# Patient Record
Sex: Female | Born: 1995 | Race: White | Hispanic: No | Marital: Married | Smoking: Never smoker
Health system: Southern US, Community
[De-identification: ages and names within clinical notes are randomized; demographics above are authoritative.]

## PROBLEM LIST (undated history)

## (undated) ENCOUNTER — Inpatient Hospital Stay (HOSPITAL_COMMUNITY): Payer: Self-pay

## (undated) DIAGNOSIS — N83209 Unspecified ovarian cyst, unspecified side: Secondary | ICD-10-CM

## (undated) DIAGNOSIS — N809 Endometriosis, unspecified: Secondary | ICD-10-CM

## (undated) HISTORY — PX: TONSILLECTOMY: SUR1361

## (undated) HISTORY — PX: CRYOTHERAPY: SHX1416

## (undated) HISTORY — PX: DILATION AND CURETTAGE OF UTERUS: SHX78

## (undated) HISTORY — PX: CYSTOTOMY: SHX926

---
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
## 2016-01-18 ENCOUNTER — Inpatient Hospital Stay (HOSPITAL_COMMUNITY): Payer: Managed Care, Other (non HMO)

## 2016-01-18 ENCOUNTER — Encounter (HOSPITAL_COMMUNITY): Payer: Self-pay | Admitting: *Deleted

## 2016-01-18 ENCOUNTER — Inpatient Hospital Stay (HOSPITAL_COMMUNITY)
Admission: AD | Admit: 2016-01-18 | Discharge: 2016-01-18 | Disposition: A | Discharge status: Home / Self Care | Payer: Managed Care, Other (non HMO) | Source: Ambulatory Visit | Attending: Obstetrics and Gynecology | Admitting: Obstetrics and Gynecology

## 2016-01-18 DIAGNOSIS — O3680X0 Pregnancy with inconclusive fetal viability, not applicable or unspecified: Secondary | ICD-10-CM | POA: Insufficient documentation

## 2016-01-18 DIAGNOSIS — R102 Pelvic and perineal pain: Secondary | ICD-10-CM

## 2016-01-18 DIAGNOSIS — Z880 Allergy status to penicillin: Secondary | ICD-10-CM | POA: Insufficient documentation

## 2016-01-18 DIAGNOSIS — Z3A01 Less than 8 weeks gestation of pregnancy: Secondary | ICD-10-CM | POA: Diagnosis not present

## 2016-01-18 DIAGNOSIS — O26891 Other specified pregnancy related conditions, first trimester: Secondary | ICD-10-CM | POA: Diagnosis present

## 2016-01-18 DIAGNOSIS — Z369 Encounter for antenatal screening, unspecified: Secondary | ICD-10-CM | POA: Insufficient documentation

## 2016-01-18 DIAGNOSIS — O3481 Maternal care for other abnormalities of pelvic organs, first trimester: Secondary | ICD-10-CM | POA: Diagnosis not present

## 2016-01-18 DIAGNOSIS — N8312 Corpus luteum cyst of left ovary: Secondary | ICD-10-CM | POA: Insufficient documentation

## 2016-01-18 DIAGNOSIS — O26899 Other specified pregnancy related conditions, unspecified trimester: Secondary | ICD-10-CM

## 2016-01-18 DIAGNOSIS — R1031 Right lower quadrant pain: Secondary | ICD-10-CM | POA: Diagnosis present

## 2016-01-18 DIAGNOSIS — R109 Unspecified abdominal pain: Secondary | ICD-10-CM

## 2016-01-18 LAB — WET PREP, GENITAL
CLUE CELLS WET PREP: NONE SEEN
SPERM: NONE SEEN
Trich, Wet Prep: NONE SEEN
Yeast Wet Prep HPF POC: NONE SEEN

## 2016-01-18 LAB — POCT PREGNANCY, URINE: PREG TEST UR: POSITIVE — AB

## 2016-01-18 NOTE — Discharge Instructions (Signed)
First Trimester of Pregnancy  The first trimester of pregnancy is from week 1 until the end of week 12 (months 1 through 3). A week after a sperm fertilizes an egg, the egg will implant on the wall of the uterus. This embryo will begin to develop into a baby. Genes from you and your partner are forming the baby. The female genes determine whether the baby is a boy or a girl. At 6-8 weeks, the eyes and face are formed, and the heartbeat can be seen on ultrasound. At the end of 12 weeks, all the baby's organs are formed.   Now that you are pregnant, you will want to do everything you can to have a healthy baby. Two of the most important things are to get good prenatal care and to follow your health care provider's instructions. Prenatal care is all the medical care you receive before the baby's birth. This care will help prevent, find, and treat any problems during the pregnancy and childbirth.  BODY CHANGES  Your body goes through many changes during pregnancy. The changes vary from woman to woman.   · You may gain or lose a couple of pounds at first.  · You may feel sick to your stomach (nauseous) and throw up (vomit). If the vomiting is uncontrollable, call your health care provider.  · You may tire easily.  · You may develop headaches that can be relieved by medicines approved by your health care provider.  · You may urinate more often. Painful urination may mean you have a bladder infection.  · You may develop heartburn as a result of your pregnancy.  · You may develop constipation because certain hormones are causing the muscles that push waste through your intestines to slow down.  · You may develop hemorrhoids or swollen, bulging veins (varicose veins).  · Your breasts may begin to grow larger and become tender. Your nipples may stick out more, and the tissue that surrounds them (areola) may become darker.  · Your gums may bleed and may be sensitive to brushing and flossing.   · Dark spots or blotches (chloasma, mask of pregnancy) may develop on your face. This will likely fade after the baby is born.  · Your menstrual periods will stop.  · You may have a loss of appetite.  · You may develop cravings for certain kinds of food.  · You may have changes in your emotions from day to day, such as being excited to be pregnant or being concerned that something may go wrong with the pregnancy and baby.  · You may have more vivid and strange dreams.  · You may have changes in your hair. These can include thickening of your hair, rapid growth, and changes in texture. Some women also have hair loss during or after pregnancy, or hair that feels dry or thin. Your hair will most likely return to normal after your baby is born.  WHAT TO EXPECT AT YOUR PRENATAL VISITS  During a routine prenatal visit:  · You will be weighed to make sure you and the baby are growing normally.  · Your blood pressure will be taken.  · Your abdomen will be measured to track your baby's growth.  · The fetal heartbeat will be listened to starting around week 10 or 12 of your pregnancy.  · Test results from any previous visits will be discussed.  Your health care provider may ask you:  · How you are feeling.  · If you   are feeling the baby move.  · If you have had any abnormal symptoms, such as leaking fluid, bleeding, severe headaches, or abdominal cramping.  · If you are using any tobacco products, including cigarettes, chewing tobacco, and electronic cigarettes.  · If you have any questions.  Other tests that may be performed during your first trimester include:  · Blood tests to find your blood type and to check for the presence of any previous infections. They will also be used to check for low iron levels (anemia) and Rh antibodies. Later in the pregnancy, blood tests for diabetes will be done along with other tests if problems develop.  · Urine tests to check for infections, diabetes, or protein in the urine.   · An ultrasound to confirm the proper growth and development of the baby.  · An amniocentesis to check for possible genetic problems.  · Fetal screens for spina bifida and Down syndrome.  · You may need other tests to make sure you and the baby are doing well.  · HIV (human immunodeficiency virus) testing. Routine prenatal testing includes screening for HIV, unless you choose not to have this test.  HOME CARE INSTRUCTIONS   Medicines  · Follow your health care provider's instructions regarding medicine use. Specific medicines may be either safe or unsafe to take during pregnancy.  · Take your prenatal vitamins as directed.  · If you develop constipation, try taking a stool softener if your health care provider approves.  Diet  · Eat regular, well-balanced meals. Choose a variety of foods, such as meat or vegetable-based protein, fish, milk and low-fat dairy products, vegetables, fruits, and whole grain breads and cereals. Your health care provider will help you determine the amount of weight gain that is right for you.  · Avoid raw meat and uncooked cheese. These carry germs that can cause birth defects in the baby.  · Eating four or five small meals rather than three large meals a day may help relieve nausea and vomiting. If you start to feel nauseous, eating a few soda crackers can be helpful. Drinking liquids between meals instead of during meals also seems to help nausea and vomiting.  · If you develop constipation, eat more high-fiber foods, such as fresh vegetables or fruit and whole grains. Drink enough fluids to keep your urine clear or pale yellow.  Activity and Exercise  · Exercise only as directed by your health care provider. Exercising will help you:    Control your weight.    Stay in shape.    Be prepared for labor and delivery.  · Experiencing pain or cramping in the lower abdomen or low back is a good sign that you should stop exercising. Check with your health care provider  before continuing normal exercises.  · Try to avoid standing for long periods of time. Move your legs often if you must stand in one place for a long time.  · Avoid heavy lifting.  · Wear low-heeled shoes, and practice good posture.  · You may continue to have sex unless your health care provider directs you otherwise.  Relief of Pain or Discomfort  · Wear a good support bra for breast tenderness.    · Take warm sitz baths to soothe any pain or discomfort caused by hemorrhoids. Use hemorrhoid cream if your health care provider approves.    · Rest with your legs elevated if you have leg cramps or low back pain.  · If you develop varicose veins in your   legs, wear support hose. Elevate your feet for 15 minutes, 3-4 times a day. Limit salt in your diet.  Prenatal Care  · Schedule your prenatal visits by the twelfth week of pregnancy. They are usually scheduled monthly at first, then more often in the last 2 months before delivery.  · Write down your questions. Take them to your prenatal visits.  · Keep all your prenatal visits as directed by your health care provider.  Safety  · Wear your seat belt at all times when driving.  · Make a list of emergency phone numbers, including numbers for family, friends, the hospital, and police and fire departments.  General Tips  · Ask your health care provider for a referral to a local prenatal education class. Begin classes no later than at the beginning of month 6 of your pregnancy.  · Ask for help if you have counseling or nutritional needs during pregnancy. Your health care provider can offer advice or refer you to specialists for help with various needs.  · Do not use hot tubs, steam rooms, or saunas.  · Do not douche or use tampons or scented sanitary pads.  · Do not cross your legs for long periods of time.  · Avoid cat litter boxes and soil used by cats. These carry germs that can cause birth defects in the baby and possibly loss of the fetus by miscarriage or stillbirth.   · Avoid all smoking, herbs, alcohol, and medicines not prescribed by your health care provider. Chemicals in these affect the formation and growth of the baby.  · Do not use any tobacco products, including cigarettes, chewing tobacco, and electronic cigarettes. If you need help quitting, ask your health care provider. You may receive counseling support and other resources to help you quit.  · Schedule a dentist appointment. At home, brush your teeth with a soft toothbrush and be gentle when you floss.  SEEK MEDICAL CARE IF:   · You have dizziness.  · You have mild pelvic cramps, pelvic pressure, or nagging pain in the abdominal area.  · You have persistent nausea, vomiting, or diarrhea.  · You have a bad smelling vaginal discharge.  · You have pain with urination.  · You notice increased swelling in your face, hands, legs, or ankles.  SEEK IMMEDIATE MEDICAL CARE IF:   · You have a fever.  · You are leaking fluid from your vagina.  · You have spotting or bleeding from your vagina.  · You have severe abdominal cramping or pain.  · You have rapid weight gain or loss.  · You vomit blood or material that looks like coffee grounds.  · You are exposed to German measles and have never had them.  · You are exposed to fifth disease or chickenpox.  · You develop a severe headache.  · You have shortness of breath.  · You have any kind of trauma, such as from a fall or a car accident.     This information is not intended to replace advice given to you by your health care provider. Make sure you discuss any questions you have with your health care provider.     Document Released: 01/22/2001 Document Revised: 02/18/2014 Document Reviewed: 12/08/2012  Elsevier Interactive Patient Education ©2017 [employer]vier Inc.

## 2016-01-18 NOTE — MAU Provider Note (Signed)
Chief Complaint: Abdominal Pain   First Provider Initiated Contact with Patient 01/18/16 1843     SUBJECTIVE HPI: Ellen Cherry is a 20 y.o. G3P0010 at 11010w0d who presents to Maternity Admissions reporting reporting RLQ pain x 2 days. [facility] [place] and states she was told they did not rise appropriately. (Upon review of Care Everywhere the quant rose 92%.) States her provider told her to go to the hospital for US since the office was closing and [place] was having w/ abnormal [place].     12/5: 1445  12/7: 2775  Location: RLQ Quality: cramping, sometimes sharp Severity: 6/10 on pain scale Duration: 2 days Course: unchanged Context: Early pregnancy Timing: constant w/ intermittent exacerbations Modifying factors: None. Hasn't tried anything for Sx.  Associated signs and symptoms: Pos for vaginal discharge. Neg for fever, chills, VB, urinary complaints, GI complaints.    Past Medical History:  Diagnosis Date  . Endometriosis   . Ovarian cyst    OB History  Gravida Para Term Preterm AB Living  3       1    SAB TAB Ectopic Multiple Live Births  1            # Outcome Date GA Lbr Len/2nd Weight Sex Delivery Anes PTL Lv  3 Current           2 SAB           1 Gravida              Past Surgical History:  Procedure Laterality Date  . CRYOTHERAPY    . CYSTOTOMY    . DILATION AND CURETTAGE OF UTERUS    . TONSILLECTOMY     Social History   Social History  . Marital status: Married    Spouse name: N/A  . Number of children: N/A  . Years of education: N/A   Occupational History  . Not on file.   Social History Main Topics  . Smoking status: Never Smoker  . Smokeless tobacco: Never Used  . Alcohol use No  . Drug use: No  . Sexual activity: Not on file   Other Topics Concern  . Not on file   Social History Narrative  . No narrative on file   No current facility-administered medications on file prior to encounter.     Current Outpatient Prescriptions on File Prior to Encounter  Medication Sig Dispense Refill  . Prenatal Vit-Fe Fumarate-FA (PRENATAL MULTIVITAMIN) TABS tablet Take 1 tablet by mouth daily.     Allergies  Allergen Reactions  . Penicillins Anaphylaxis, Hives and Other (See Comments)    Has patient had a PCN reaction causing immediate rash, facial/tongue/throat swelling, SOB or lightheadedness with hypotension: Yes Has patient had a PCN reaction causing severe rash involving mucus membranes or skin necrosis: No Has patient had a PCN reaction that required hospitalization No Has patient had a PCN reaction occurring within the last 10 years: Yes If all of the above answers are "NO", then may proceed with Cephalosporin use.  . Flagyl [Metronidazole] Hives    I have reviewed the past Medical Hx, Surgical Hx, Social Hx, Allergies and Medications.   Review of Systems  Constitutional: Negative for appetite change, chills and fever.  Gastrointestinal: Positive for abdominal pain. Negative for abdominal distention, constipation, diarrhea, nausea and vomiting.  Genitourinary: Positive for vaginal discharge. Negative for dysuria, flank pain, hematuria and vaginal bleeding.  Musculoskeletal: Negative for back pain.  OBJECTIVE Patient Vitals for the past 24 hrs:  BP Temp Temp src Pulse Resp Height Weight  01/18/16 1824 134/71 98.9 F (37.2 C) Oral 114 18 5\' 3"  (1.6 m) 127 lb 12.8 oz (58 kg)   Constitutional: Well-developed, well-nourished female in no acute distress.  Cardiovascular: Mild tachycardia Respiratory: normal rate and effort.  GI: Abd soft, non-tender. Pos BS x 4 MS: Extremities nontender, no edema, normal ROM Neurologic: Alert and oriented x 4.  GU: Neg CVAT.  SPECULUM EXAM: NEFG, physiologic discharge, no blood noted, cervix clean  BIMANUAL: cervix closed; uterus top-normal size, no adnexal tenderness or masses. No CMT.  LAB RESULTS Results for orders placed or performed  during the hospital encounter of 01/18/16 (from the past 24 hour(s))  Wet prep, genital     Status: Abnormal   Collection Time: 01/18/16  7:01 PM  Result Value Ref Range   Yeast Wet Prep HPF POC NONE SEEN NONE SEEN   Trich, Wet Prep NONE SEEN NONE SEEN   Clue Cells Wet Prep HPF POC NONE SEEN NONE SEEN   WBC, Wet Prep HPF POC MANY (A) NONE SEEN   Sperm NONE SEEN   Pregnancy, urine POC     Status: Abnormal   Collection Time: 01/18/16  7:19 PM  Result Value Ref Range   Preg Test, Ur POSITIVE (A) NEGATIVE    IMAGING [place] Ob Comp Less 14 Wks  Result Date: 01/18/2016 CLINICAL DATA:  Abdominal pain EXAM: OBSTETRIC <14 WK US AND TRANSVAGINAL OB US TECHNIQUE: Both transabdominal and transvaginal ultrasound examinations were performed for complete evaluation of the gestation as well as the maternal uterus, adnexal regions, and pelvic cul-de-sac. Transvaginal technique was performed to assess early pregnancy. COMPARISON:  None. FINDINGS: Intrauterine gestational sac: Single Yolk sac:  Possibly visualized. Embryo:  Not Visualized. Cardiac Activity: Not Visualized. MSD: 4  mm   5 w   1  d Subchorionic hemorrhage:  None visualized. Maternal uterus/adnexae: Left corpus luteum cyst. Otherwise normal bilateral ovaries. Small amount of pelvic free fluid. IMPRESSION: 1. Probable early intrauterine gestational sac, but no yolk sac, fetal pole, or cardiac activity yet visualized. Recommend follow-up quantitative B-HCG levels and follow-up US in 14 days to assess viability. This recommendation follows SRU consensus guidelines: Diagnostic Criteria for Nonviable Pregnancy Early in the First Trimester. Malva Limes Engl J Med 2013; 161:0960-45; 369:1443-51. Electronically Signed   By: Elige KoHetal  Patel   On: 01/18/2016 20:03   [place] Ob Transvaginal  Result Date: 01/18/2016 CLINICAL DATA:  Abdominal pain EXAM: OBSTETRIC <14 WK US AND TRANSVAGINAL OB US TECHNIQUE: Both transabdominal and transvaginal ultrasound examinations were performed for complete  evaluation of the gestation as well as the maternal uterus, adnexal regions, and pelvic cul-de-sac. Transvaginal technique was performed to assess early pregnancy. COMPARISON:  None. FINDINGS: Intrauterine gestational sac: Single Yolk sac:  Possibly visualized. Embryo:  Not Visualized. Cardiac Activity: Not Visualized. MSD: 4  mm   5 w   1  d Subchorionic hemorrhage:  None visualized. Maternal uterus/adnexae: Left corpus luteum cyst. Otherwise normal bilateral ovaries. Small amount of pelvic free fluid. IMPRESSION: 1. Probable early intrauterine gestational sac, but no yolk sac, fetal pole, or cardiac activity yet visualized. Recommend follow-up quantitative B-HCG levels and follow-up US in 14 days to assess viability. This recommendation follows SRU consensus guidelines: Diagnostic Criteria for Nonviable Pregnancy Early in the First Trimester. Malva Limes Engl J Med 2013; 409:8119-14; 369:1443-51. Electronically Signed   By: Elige KoHetal  Patel   On: 01/18/2016 20:03    MAU  COURSE ultrasound, wet prep and GC/chlamydia culture, UA.Marland Kitchen Refused bloodwork. Able to review HCG and ABO/Rh from [facility] office.  Reviewed [place] with the patient.   Care of pt turned over to [employer] ,Cnm at 0800. [place] results pending.   [place], CNM 01/18/2016  7:57 PM  Assessment/Plan  1. Pregnancy of unknown anatomic location   2. Abdominal pain affecting pregnancy, antepartum    Offered patient to have US done here in 7-10 days or at [facility] office. She would like to have US done at [facility] office.   DC home Comfort measures reviewed  1st Trimester precautions  Bleeding precautions Ectopic precautions RX: none  Return to MAU as needed FU with OB as planned  Follow-up Information    Reynold Bowen, MD Follow up.   Specialty:  Obstetrics and Gynecology Contact information: [address] [place] [address]

## 2016-01-18 NOTE — MAU Note (Signed)
Pt reports she is pregnant. C/O RLQ pain for  About 3 days. [place] and was called today that her BHCG level did not double and since she was having pain she needed to come  In to make sure it is not an ectopic. Pt stated she has had 3 2 miscarriages before and it feels a little like the same thing.

## 2016-01-19 LAB — GC/CHLAMYDIA PROBE AMP (~~LOC~~) NOT AT ARMC
CHLAMYDIA, DNA PROBE: NEGATIVE
Neisseria Gonorrhea: NEGATIVE

## 2016-02-11 ENCOUNTER — Inpatient Hospital Stay (HOSPITAL_COMMUNITY): Payer: Managed Care, Other (non HMO)

## 2016-02-11 ENCOUNTER — Inpatient Hospital Stay (HOSPITAL_COMMUNITY)
Admission: AD | Admit: 2016-02-11 | Discharge: 2016-02-11 | Disposition: A | Discharge status: Home / Self Care | Payer: Managed Care, Other (non HMO) | Source: Ambulatory Visit | Attending: Obstetrics and Gynecology | Admitting: Obstetrics and Gynecology

## 2016-02-11 ENCOUNTER — Encounter (HOSPITAL_COMMUNITY): Payer: Self-pay | Admitting: Student

## 2016-02-11 DIAGNOSIS — R1030 Lower abdominal pain, unspecified: Secondary | ICD-10-CM | POA: Insufficient documentation

## 2016-02-11 DIAGNOSIS — Z3491 Encounter for supervision of normal pregnancy, unspecified, first trimester: Secondary | ICD-10-CM

## 2016-02-11 DIAGNOSIS — O219 Vomiting of pregnancy, unspecified: Secondary | ICD-10-CM

## 2016-02-11 DIAGNOSIS — Z3A01 Less than 8 weeks gestation of pregnancy: Secondary | ICD-10-CM | POA: Diagnosis not present

## 2016-02-11 DIAGNOSIS — R109 Unspecified abdominal pain: Secondary | ICD-10-CM

## 2016-02-11 DIAGNOSIS — O209 Hemorrhage in early pregnancy, unspecified: Secondary | ICD-10-CM | POA: Insufficient documentation

## 2016-02-11 DIAGNOSIS — O210 Mild hyperemesis gravidarum: Secondary | ICD-10-CM | POA: Insufficient documentation

## 2016-02-11 DIAGNOSIS — O26899 Other specified pregnancy related conditions, unspecified trimester: Secondary | ICD-10-CM

## 2016-02-11 LAB — URINALYSIS, ROUTINE W REFLEX MICROSCOPIC
Bilirubin Urine: NEGATIVE
GLUCOSE, UA: NEGATIVE mg/dL
Hgb urine dipstick: NEGATIVE
KETONES UR: 5 mg/dL — AB
LEUKOCYTES UA: NEGATIVE
Nitrite: NEGATIVE
PH: 5 (ref 5.0–8.0)
Protein, ur: NEGATIVE mg/dL
SPECIFIC GRAVITY, URINE: 1.026 (ref 1.005–1.030)

## 2016-02-11 LAB — WET PREP, GENITAL
CLUE CELLS WET PREP: NONE SEEN
SPERM: NONE SEEN
Trich, Wet Prep: NONE SEEN
Yeast Wet Prep HPF POC: NONE SEEN

## 2016-02-11 MED ORDER — PROMETHAZINE HCL 25 MG PO TABS: 25.0000 mg | ORAL_TABLET | Freq: Four times a day (QID) | ORAL | 0 refills | Status: AC | PRN
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
## 2016-02-11 NOTE — MAU Note (Signed)
Patient presents with vaginal bleeding since Friday, spotting on Friday and Saturday, today clumpy, not wearing a pad.

## 2016-02-11 NOTE — Discharge Instructions (Signed)
Morning Sickness Morning sickness is when you feel sick to your stomach (nauseous) during pregnancy. This nauseous feeling may or may not come with vomiting. It often occurs in the morning but can be a problem any time of day. Morning sickness is most common during the first trimester, but it may continue throughout pregnancy. While morning sickness is unpleasant, it is usually harmless unless you develop severe and continual vomiting (hyperemesis gravidarum). This condition requires more intense treatment. What are the causes? The cause of morning sickness is not completely known but seems to be related to normal hormonal changes that occur in pregnancy. What increases the risk? You are at greater risk if you:  Experienced nausea or vomiting before your pregnancy.  Had morning sickness during a previous pregnancy.  Are pregnant with more than one baby, such as twins. How is this treated? Do not use any medicines (prescription, over-the-counter, or herbal) for morning sickness without first talking to your health care provider. Your health care provider may prescribe or recommend:  Vitamin B6 supplements.  Anti-nausea medicines.  The herbal medicine ginger. Follow these instructions at home:  Only take over-the-counter or prescription medicines as directed by your health care provider.  Taking multivitamins before getting pregnant can prevent or decrease the severity of morning sickness in most women.  Eat a piece of dry toast or unsalted crackers before getting out of bed in the morning.  Eat five or six small meals a day.  Eat dry and bland foods (rice, baked potato). Foods high in carbohydrates are often helpful.  Do not drink liquids with your meals. Drink liquids between meals.  Avoid greasy, fatty, and spicy foods.  Get someone to cook for you if the smell of any food causes nausea and vomiting.  If you feel nauseous after taking prenatal vitamins, take the vitamins at  night or with a snack.  Snack on protein foods (nuts, yogurt, cheese) between meals if you are hungry.  Eat unsweetened gelatins for desserts.  Wearing an acupressure wristband (worn for sea sickness) may be helpful.  Acupuncture may be helpful.  Do not smoke.  Get a humidifier to keep the air in your house free of odors.  Get plenty of fresh air. Contact a health care provider if:  Your home remedies are not working, and you need medicine.  You feel dizzy or lightheaded.  You are losing weight. Get help right away if:  You have persistent and uncontrolled nausea and vomiting.  You pass out (faint). This information is not intended to replace advice given to you by your health care provider. Make sure you discuss any questions you have with your health care provider. Document Released: 03/21/2006 Document Revised: 07/06/2015 Document Reviewed: 07/15/2012 Elsevier Interactive Patient Education  2017 Elsevier Inc. Vaginal Bleeding During Pregnancy, First Trimester A small amount of bleeding (spotting) from the vagina is common in early pregnancy. Sometimes the bleeding is normal and is not a problem, and sometimes it is a sign of something serious. Be sure to tell your doctor about any bleeding from your vagina right away. Follow these instructions at home:  Watch your condition for any changes.  Follow your doctor's instructions about how active you can be.  If you are on bed rest:  You may need to stay in bed and only get up to use the bathroom.  You may be allowed to do some activities.  If you need help, make plans for someone to help you.  Write down:  The number of pads you use each day.  How often you change pads.  How soaked (saturated) your pads are.  Do not use tampons.  Do not douche.  Do not have sex or orgasms until your doctor says it is okay.  If you pass any tissue from your vagina, save the tissue so you can show it to your doctor.  Only  take medicines as told by your doctor.  Do not take aspirin because it can make you bleed.  Keep all follow-up visits as told by your doctor. Contact a doctor if:  You bleed from your vagina.  You have cramps.  You have labor pains.  You have a fever that does not go away after you take medicine. Get help right away if:  You have very bad cramps in your back or belly (abdomen).  You pass large clots or tissue from your vagina.  You bleed more.  You feel light-headed or weak.  You pass out (faint).  You have chills.  You are leaking fluid or have a gush of fluid from your vagina.  You pass out while pooping (having a bowel movement). This information is not intended to replace advice given to you by your health care provider. Make sure you discuss any questions you have with your health care provider. Document Released: 06/14/2013 Document Revised: 07/06/2015 Document Reviewed: 10/05/2012 Elsevier Interactive Patient Education  2017 [employer]

## 2016-02-11 NOTE — MAU Provider Note (Signed)
History     CSN: [id number]  Arrival date and time: 02/11/16 1451   First Provider Initiated Contact with Patient 02/11/16 1601       Chief Complaint  Patient presents with  . Vaginal Bleeding   HPI Ellen Cherry is a 20 y.o. G3P0020 at 4117w5d who presents with vaginal bleeding & abdominal cramping. Symptoms began Friday after having intercourse. Initially was brown spotting on toilet paper that turned red and pink today. Also reports lower abdominal pain that is intermittent & cramp like. Rates pain 5/10. Has not treated. Endorses nausea, no vomiting. Has diclegis but hasn't taken it b/c she is concerned about taking too many medications; pt recently diagnosed with sinusitis & started on abx. Hx of SABs x 2; states they "started out like this" and she is very nervous. [facility] [place] Call [place] & was told to go to hospital for evaluation. Was seen there last week & had ultrasound that confirmed IUP (per Care Everywhere).   OB History    Gravida Para Term Preterm AB Living   3       2     SAB TAB Ectopic Multiple Live Births   2              Past Medical History:  Diagnosis Date  . Endometriosis   . Ovarian cyst     Past Surgical History:  Procedure Laterality Date  . CRYOTHERAPY    . CYSTOTOMY    . DILATION AND CURETTAGE OF UTERUS    . TONSILLECTOMY      Family History  Problem Relation Age of Onset  . Cancer Mother   . Hypertension Father   . Hypertension Paternal Grandfather     Social History  Substance Use Topics  . Smoking status: Never Smoker  . Smokeless tobacco: Never Used  . Alcohol use No    Allergies:  Allergies  Allergen Reactions  . Penicillins Anaphylaxis, Hives and Other (See Comments)    Has patient had a PCN reaction causing immediate rash, facial/tongue/throat swelling, SOB or lightheadedness with hypotension: Yes Has patient had a PCN reaction causing severe rash involving mucus membranes or skin necrosis: No Has patient  had a PCN reaction that required hospitalization No Has patient had a PCN reaction occurring within the last 10 years: Yes If all of the above answers are "NO", then may proceed with Cephalosporin use.  . Flagyl [Metronidazole] Hives    Prescriptions Prior to Admission  Medication Sig Dispense Refill Last Dose  . Prenatal Vit-Fe Fumarate-FA (PRENATAL MULTIVITAMIN) TABS tablet Take 1 tablet by mouth daily.   01/18/2016 at Unknown time  . progesterone (PROMETRIUM) 200 MG capsule Take 200 mg by mouth every evening.    01/17/2016 at Unknown time    Review of Systems  Constitutional: Negative.   Gastrointestinal: Positive for abdominal pain and nausea. Negative for constipation, diarrhea and vomiting.  Genitourinary: Negative for dysuria.       + vaginal bleeding   Physical Exam   Blood pressure 113/74, pulse 104, temperature 98 F (36.7 C), temperature source Oral, resp. rate 16, height 5\' 3"  (1.6 m), weight 127 lb 0.6 oz (57.6 kg), last menstrual period 12/14/2015, unknown if currently breastfeeding.  Physical Exam  Nursing note and vitals reviewed. Constitutional: She is oriented to person, place, and time. She appears well-developed and well-nourished. No distress.  HENT:  Head: Normocephalic and atraumatic.  Eyes: Conjunctivae are normal. Right eye exhibits no discharge. Left eye  exhibits no discharge. No scleral icterus.  Neck: Normal range of motion.  Cardiovascular: Normal rate, regular rhythm and normal heart sounds.   No murmur heard. Respiratory: Effort normal and breath sounds normal. No respiratory distress. She has no wheezes.  GI: Soft. Bowel sounds are normal. She exhibits no distension. There is no tenderness. There is no rebound and no guarding.  Genitourinary: No bleeding in the vagina. Vaginal discharge (small amount of thin tan discharge) found.  Genitourinary Comments: Cervix closed  Neurological: She is alert and oriented to person, place, and time.  Skin: Skin is  warm and dry. She is not diaphoretic.  Psychiatric: She has a normal mood and affect. Her behavior is normal. Judgment and thought content normal.    MAU Course  Procedures Results for orders placed or performed during the hospital encounter of 02/11/16 (from the past 24 hour(s))  Urinalysis, Routine w reflex microscopic     Status: Abnormal   Collection Time: 02/11/16  3:10 PM  Result Value Ref Range   Color, Urine YELLOW YELLOW   APPearance HAZY (A) CLEAR   Specific Gravity, Urine 1.026 1.005 - 1.030   pH 5.0 5.0 - 8.0   Glucose, UA NEGATIVE NEGATIVE mg/dL   Hgb urine dipstick NEGATIVE NEGATIVE   Bilirubin Urine NEGATIVE NEGATIVE   Ketones, ur 5 (A) NEGATIVE mg/dL   Protein, ur NEGATIVE NEGATIVE mg/dL   Nitrite NEGATIVE NEGATIVE   Leukocytes, UA NEGATIVE NEGATIVE  Wet prep, genital     Status: Abnormal   Collection Time: 02/11/16  4:22 PM  Result Value Ref Range   Yeast Wet Prep HPF POC NONE SEEN NONE SEEN   Trich, Wet Prep NONE SEEN NONE SEEN   Clue Cells Wet Prep HPF POC NONE SEEN NONE SEEN   WBC, Wet Prep HPF POC FEW (A) NONE SEEN   Sperm NONE SEEN    [place] Ob Transvaginal  Result Date: 02/11/2016 CLINICAL DATA:  Pregnant patient with lower abdominal cramping and vaginal bleeding. EXAM: OBSTETRIC <14 WK ULTRASOUND TECHNIQUE: Transabdominal ultrasound was performed for evaluation of the gestation as well as the maternal uterus and adnexal regions. COMPARISON:  None. FINDINGS: Intrauterine gestational sac: Single Yolk sac:  Visualized. Embryo:  Visualized. Cardiac Activity: Visualized. Heart Rate: 186 bpm CRL:   17  mm   8 w 0 d                  US EDC: 09/22/2016 Subchorionic hemorrhage:  None visualized. Maternal uterus/adnexae: Unremarkable right and left ovaries. No free fluid in the pelvis. IMPRESSION: Single live intrauterine gestation.  No subchorionic hemorrhage. Electronically Signed   By: Annia Beltrew  Buccieri M.D.   On: 02/11/2016 16:58    MDM U/a & wet prep Ultrasound -- SIUP  with cardiac activity No blood on exam  Assessment and Plan  A; 1. Normal IUP (intrauterine pregnancy) on prenatal ultrasound, first trimester   2. Vaginal bleeding in pregnancy, first trimester   3. Abdominal cramping affecting pregnancy   4. Nausea and vomiting during pregnancy prior to [redacted] weeks gestation    P: Discharge home Rx phenergan Pelvic rest Keep f/u with OB Discussed reasons to return  Ellen Cherry Ellen Cherry 02/11/2016, 3:51 PM

## 2016-07-28 ENCOUNTER — Inpatient Hospital Stay (HOSPITAL_COMMUNITY)
Admission: AD | Admit: 2016-07-28 | Discharge: 2016-07-28 | Disposition: A | Discharge status: Home / Self Care | Payer: Managed Care, Other (non HMO) | Source: Ambulatory Visit | Attending: Obstetrics & Gynecology | Admitting: Obstetrics & Gynecology

## 2016-07-28 ENCOUNTER — Encounter (HOSPITAL_COMMUNITY): Payer: Self-pay

## 2016-07-28 DIAGNOSIS — R51 Headache: Secondary | ICD-10-CM | POA: Diagnosis present

## 2016-07-28 DIAGNOSIS — N83209 Unspecified ovarian cyst, unspecified side: Secondary | ICD-10-CM | POA: Diagnosis not present

## 2016-07-28 DIAGNOSIS — H9202 Otalgia, left ear: Secondary | ICD-10-CM

## 2016-07-28 DIAGNOSIS — R519 Headache, unspecified: Secondary | ICD-10-CM

## 2016-07-28 DIAGNOSIS — Z809 Family history of malignant neoplasm, unspecified: Secondary | ICD-10-CM | POA: Diagnosis not present

## 2016-07-28 DIAGNOSIS — Z8249 Family history of ischemic heart disease and other diseases of the circulatory system: Secondary | ICD-10-CM | POA: Diagnosis not present

## 2016-07-28 DIAGNOSIS — Z3A31 31 weeks gestation of pregnancy: Secondary | ICD-10-CM | POA: Diagnosis not present

## 2016-07-28 DIAGNOSIS — O3483 Maternal care for other abnormalities of pelvic organs, third trimester: Secondary | ICD-10-CM | POA: Diagnosis present

## 2016-07-28 DIAGNOSIS — R202 Paresthesia of skin: Secondary | ICD-10-CM | POA: Diagnosis not present

## 2016-07-28 DIAGNOSIS — Z880 Allergy status to penicillin: Secondary | ICD-10-CM | POA: Diagnosis not present

## 2016-07-28 DIAGNOSIS — Z888 Allergy status to other drugs, medicaments and biological substances status: Secondary | ICD-10-CM | POA: Diagnosis not present

## 2016-07-28 DIAGNOSIS — O26893 Other specified pregnancy related conditions, third trimester: Secondary | ICD-10-CM | POA: Insufficient documentation

## 2016-07-28 LAB — URINALYSIS, ROUTINE W REFLEX MICROSCOPIC
Bilirubin Urine: NEGATIVE
GLUCOSE, UA: NEGATIVE mg/dL
Ketones, ur: NEGATIVE mg/dL
Leukocytes, UA: NEGATIVE
Nitrite: NEGATIVE
PROTEIN: NEGATIVE mg/dL
Specific Gravity, Urine: 1.012 (ref 1.005–1.030)
pH: 6 (ref 5.0–8.0)

## 2016-07-28 MED ORDER — BUTALBITAL-APAP-CAFFEINE 50-325-40 MG PO TABS
2.0000 | ORAL_TABLET | Freq: Once | ORAL | Status: AC
  Administered 2016-07-28: 2 via ORAL
  Filled 2016-07-28: qty 2

## 2016-07-28 NOTE — MAU Provider Note (Signed)
History   G3P0020 @ 31.5 wks in withj c/o headache, and what she thinks is a ear infection. C/o tingling in ear and next to ear on her face. Denies ROM, vag bleeding or abd pain. Gets her care in [place].  CSN: [id number]  Arrival date & time 07/28/16  1809   None     No chief complaint on file.   HPI  Past Medical History:  Diagnosis Date  . Endometriosis   . Ovarian cyst     Past Surgical History:  Procedure Laterality Date  . CRYOTHERAPY    . CYSTOTOMY    . DILATION AND CURETTAGE OF UTERUS    . TONSILLECTOMY      Family History  Problem Relation Age of Onset  . Cancer Mother   . Hypertension Father   . Hypertension Paternal Grandfather     Social History  Substance Use Topics  . Smoking status: Never Smoker  . Smokeless tobacco: Never Used  . Alcohol use No    OB History    Gravida Para Term Preterm AB Living   3 0     2     SAB TAB Ectopic Multiple Live Births   2              Review of Systems  Constitutional: Negative.   HENT: Positive for ear pain.   Eyes: Negative.   Respiratory: Negative.   Cardiovascular: Negative.   Gastrointestinal: Negative.   Endocrine: Negative.   Genitourinary: Negative.   Musculoskeletal: Negative.   Skin: Negative.   Allergic/Immunologic: Negative.   Neurological: Positive for headaches.  Hematological: Negative.   Psychiatric/Behavioral: Negative.     Allergies  Amoxicillin; Penicillins; and Flagyl [metronidazole]  Home Medications    LMP 12/14/2015   SpO2 99%   Physical Exam  Constitutional: She is oriented to person, place, and time. She appears well-developed and well-nourished.  HENT:  Head: Normocephalic.  Eyes: Pupils are equal, round, and reactive to light.  Neck: Normal range of motion.  Cardiovascular: Normal rate and normal heart sounds.   Pulmonary/Chest: Effort normal and breath sounds normal.  Abdominal: Soft.  Genitourinary: Vagina normal and uterus normal.  Musculoskeletal:  Normal range of motion.  Neurological: She is alert and oriented to person, place, and time. She has normal reflexes.  Skin: Skin is warm and dry.  Psychiatric: She has a normal mood and affect. Her behavior is normal. Judgment and thought content normal.    MAU Course  Procedures (including critical care time)  Labs Reviewed  URINALYSIS, ROUTINE W REFLEX MICROSCOPIC - Abnormal; Notable for the following:       Result Value   Hgb urine dipstick SMALL (*)    Bacteria, UA RARE (*)    Squamous Epithelial / LPF 0-5 (*)    All other components within normal limits   No results found.   No diagnosis found.    MDM  Neuro intact, no deficits. VSS, FHR pattern reassuring. Will treat headache with fioricet and if resolves d/c home.

## 2016-07-28 NOTE — MAU Note (Signed)
Pt was admitted this past Wednesday to [facility] for preterm labor, received Magnesium Sulfate and steroids and was released Friday.  Today started with numbness on the left side of her face and goes down the back of her neck. She reports headaches and dizziness for the past week; tylenol has not relieved headaches and  has been on metoprolol for bp this preg.   Denies any painful contractions, vag bleeding or leaking.

## 2016-07-28 NOTE — Discharge Instructions (Signed)
General Headache Without Cause A headache is pain or discomfort felt around the head or neck area. The specific cause of a headache may not be found. There are many causes and types of headaches. A few common ones are:  Tension headaches.  Migraine headaches.  Cluster headaches.  Chronic daily headaches.  Follow these instructions at home: Watch your condition for any changes. Take these steps to help with your condition: Managing pain  Take over-the-counter and prescription medicines only as told by your health care provider.  Lie down in a dark, quiet room when you have a headache.  If directed, apply ice to the head and neck area: ? Put ice in a plastic bag. ? Place a towel between your skin and the bag. ? Leave the ice on for 20 minutes, 2-3 times per day.  Use a heating pad or hot shower to apply heat to the head and neck area as told by your health care provider.  Keep lights dim if bright lights bother you or make your headaches worse. Eating and drinking  Eat meals on a regular schedule.  Limit alcohol use.  Decrease the amount of caffeine you drink, or stop drinking caffeine. General instructions  Keep all follow-up visits as told by your health care provider. This is important.  Keep a headache journal to help find out what may trigger your headaches. For example, write down: ? What you eat and drink. ? How much sleep you get. ? Any change to your diet or medicines.  Try massage or other relaxation techniques.  Limit stress.  Sit up straight, and do not tense your muscles.  Do not use tobacco products, including cigarettes, chewing tobacco, or e-cigarettes. If you need help quitting, ask your health care provider.  Exercise regularly as told by your health care provider.  Sleep on a regular schedule. Get 7-9 hours of sleep, or the amount recommended by your health care provider. Contact a health care provider if:  Your symptoms are not helped by  medicine.  You have a headache that is different from the usual headache.  You have nausea or you vomit.  You have a fever. Get help right away if:  Your headache becomes severe.  You have repeated vomiting.  You have a stiff neck.  You have a loss of vision.  You have problems with speech.  You have pain in the eye or ear.  You have muscular weakness or loss of muscle control.  You lose your balance or have trouble walking.  You feel faint or pass out.  You have confusion. This information is not intended to replace advice given to you by your health care provider. Make sure you discuss any questions you have with your health care provider. Document Released: 01/28/2005 Document Revised: 07/06/2015 Document Reviewed: 05/23/2014 Elsevier Interactive Patient Education  2017 [employer]  

## 2016-07-28 NOTE — Progress Notes (Addendum)
G3P0 @ 31.[redacted] wksga. Presents to triage for left ear numbness and pressure in ear. Dizziness and ha included when triage nurse assessed pt. Took tylenol 650 mg for the ha @ 1200. States helped some.   Denies any OB issues. No LOF or bleeding or ctx  Admitted to [place] [facility] Wednesday for Mgso4 and betamethasone and released on Friday. Went to urgent care today and was told since pregnant she needed to get care else where.   1905: Provider at [facility] assessing. Looking inside both ears.   1910: fiorcet 2 tabs orders.   2005: pt states feeling better and ha gone.   Discharge instructions given with pt understanding.

## 2016-11-22 ENCOUNTER — Encounter (HOSPITAL_COMMUNITY): Payer: Self-pay

## 2017-11-03 IMAGING — US US OB TRANSVAGINAL
1 series · 15 of 28 positions shown · non-contrast
Comparison: None.

CLINICAL DATA: Abdominal pain

EXAM:
OBSTETRIC <14 WK US AND TRANSVAGINAL OB US
TECHNIQUE: Both transabdominal and transvaginal ultrasound examinations were
performed for complete evaluation of the gestation as well as the
maternal uterus, adnexal regions, and pelvic cul-de-sac.
Transvaginal technique was performed to assess early pregnancy.

[Series 1: us ob transvaginal · 61 acquisitions, 15 frames shown]
[im 1/61]
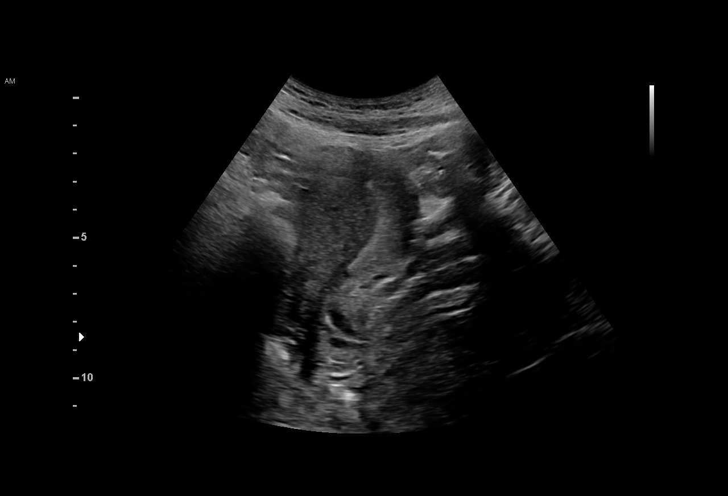
[im 5/61]
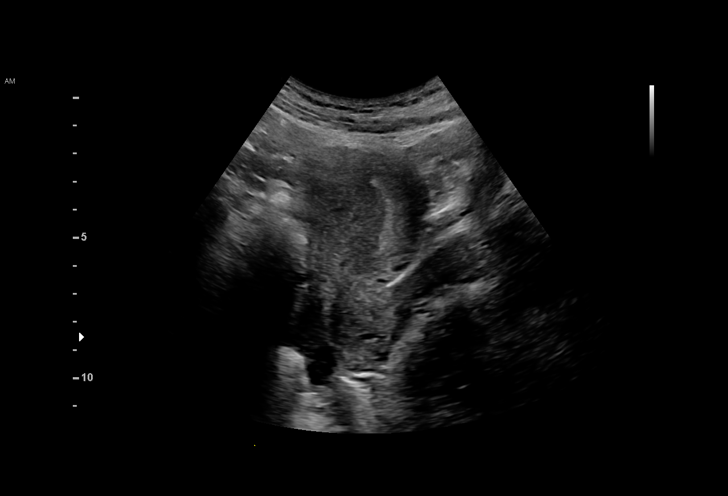
[im 9/61]
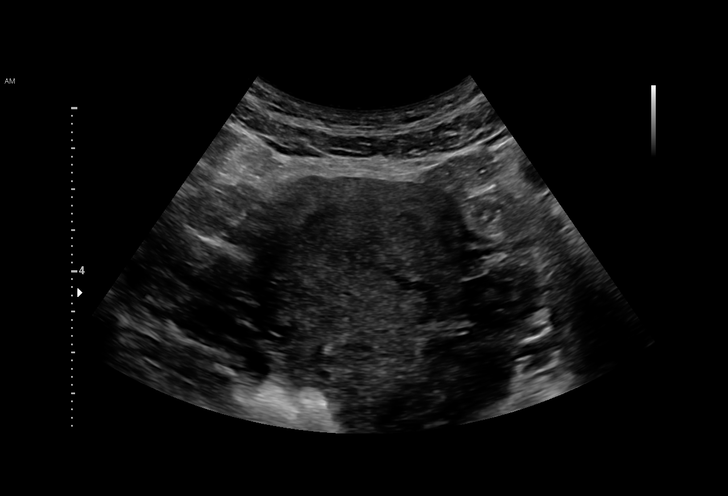
[im 14/61]
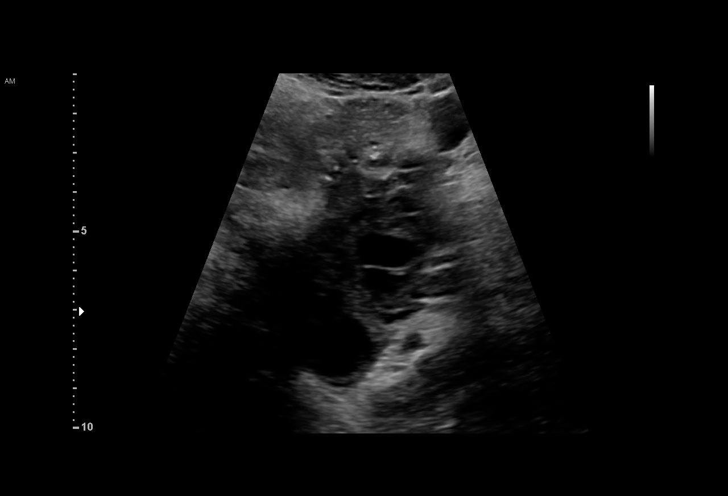
[im 18/61]
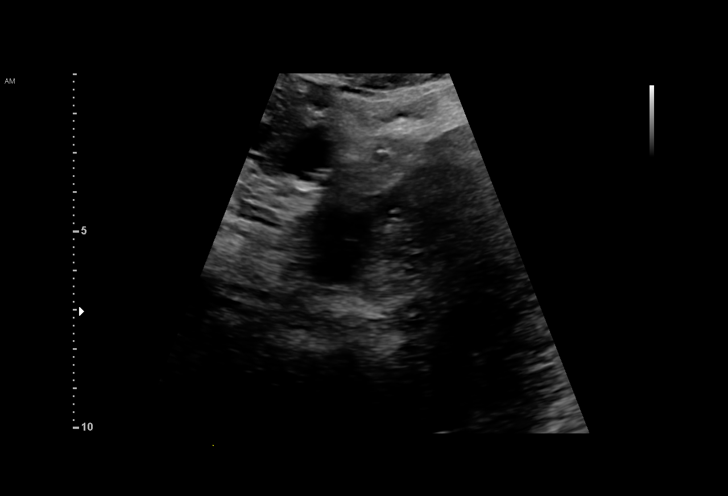
[im 23/61]
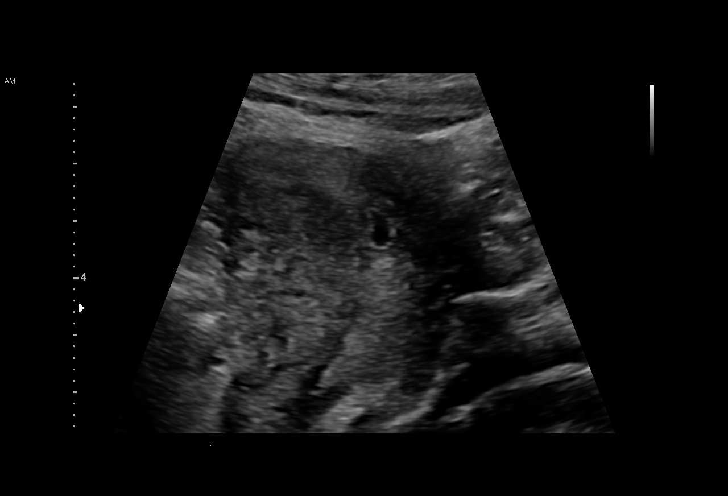
[im 27/61]
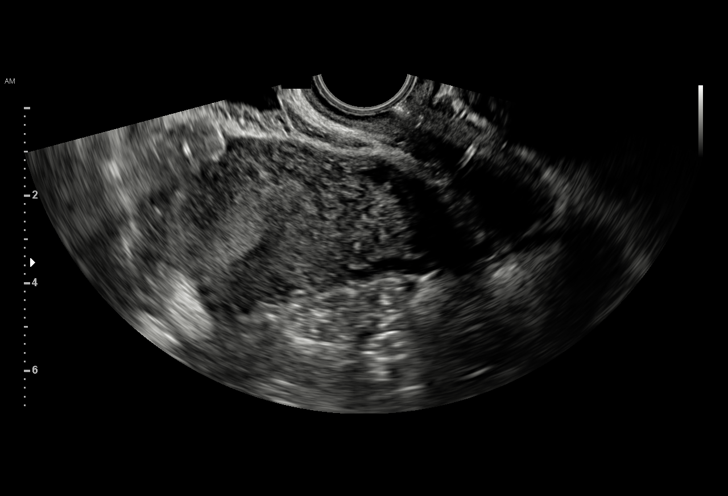
[im 32/61]
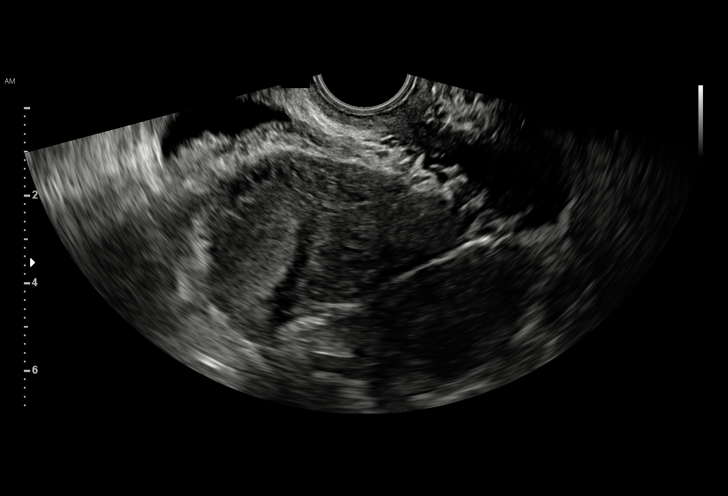
[im 34/61]
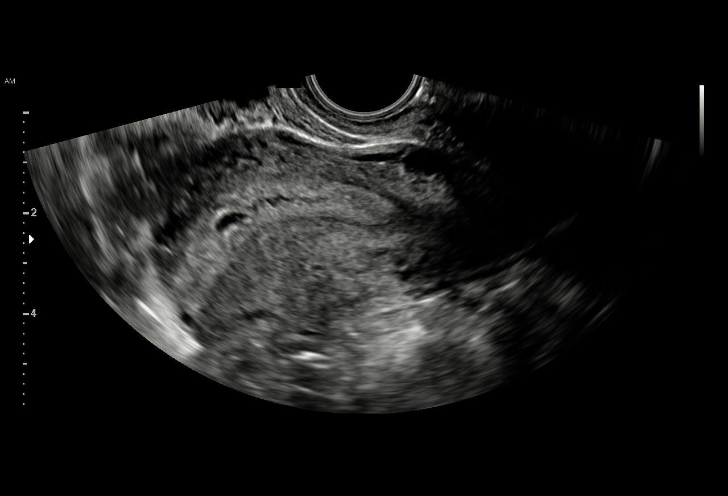
[im 38/61]
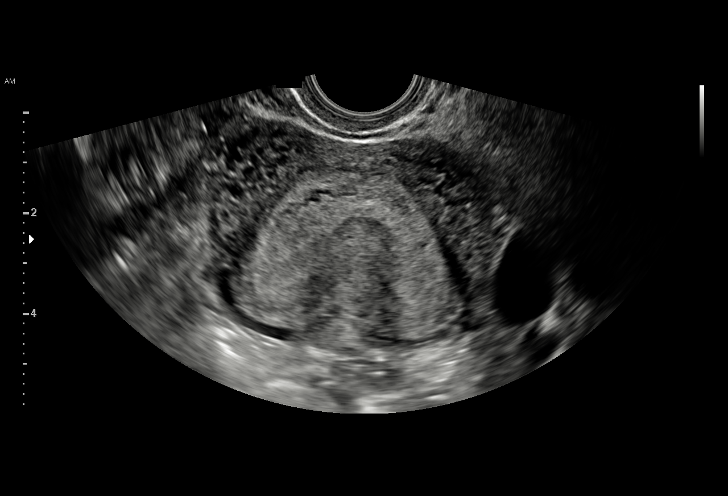
[im 43/61]
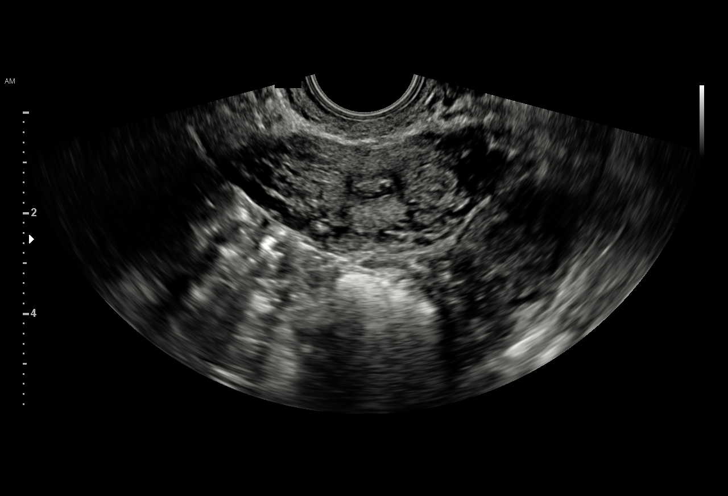
[im 47/61]
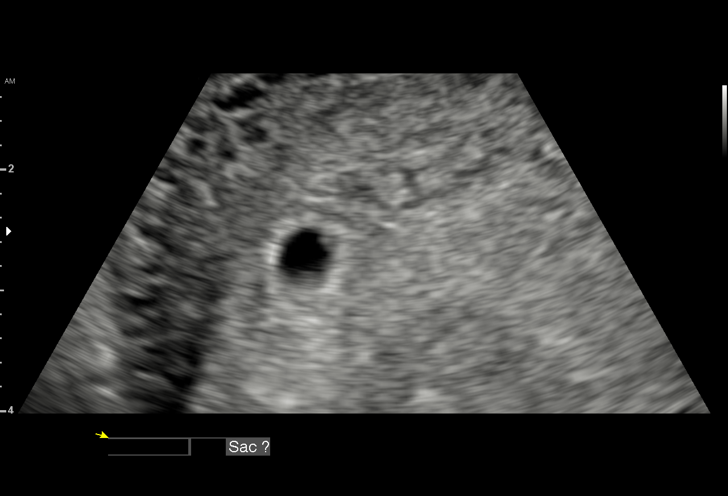
[im 52/61]
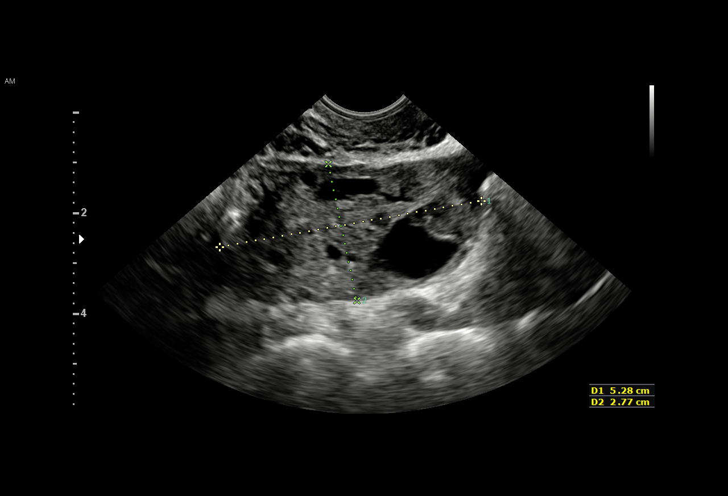
[im 56/61]
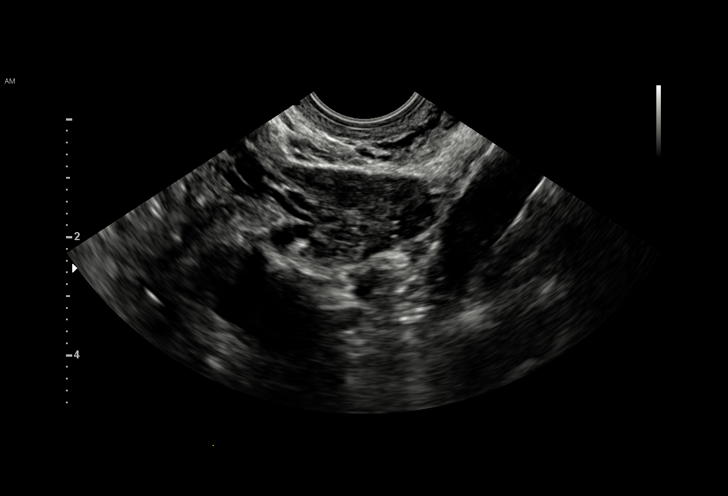
[im 61/61]
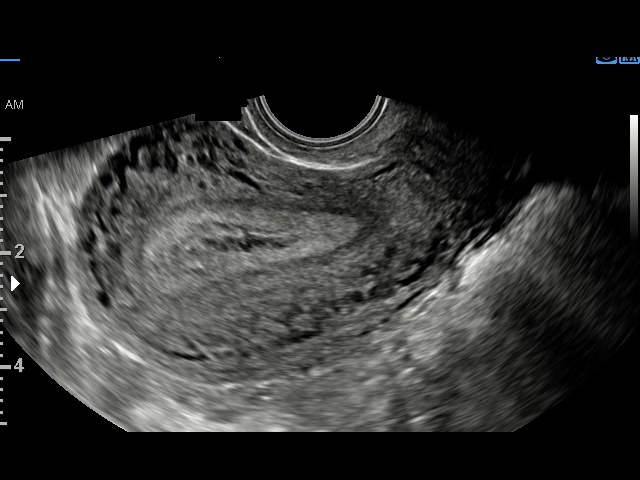

[15 of 28 positions shown; findings below may reference images not displayed]

FINDINGS: Intrauterine gestational sac: Single

Yolk sac:  Possibly visualized.

Embryo:  Not Visualized.

Cardiac Activity: Not Visualized.

MSD: 4  mm   5 w   1  d

Subchorionic hemorrhage:  None visualized.

Maternal uterus/adnexae: Left corpus luteum cyst. Otherwise normal
bilateral ovaries. Small amount of pelvic free fluid.
IMPRESSION: 1. Probable early intrauterine gestational sac, but no yolk sac,
fetal pole, or cardiac activity yet visualized. Recommend follow-up
quantitative B-HCG levels and follow-up US in 14 days to assess
viability. This recommendation follows SRU consensus guidelines:
Diagnostic Criteria for Nonviable Pregnancy Early in the First
Trimester. N Engl J Med 5686; [DATE].

## 2017-11-27 IMAGING — US US OB TRANSVAGINAL
1 series · 15 of 28 positions shown · non-contrast
Comparison: None.

CLINICAL DATA: Pregnant patient with lower abdominal cramping and
vaginal bleeding.

EXAM:
OBSTETRIC <14 WK ULTRASOUND
TECHNIQUE: Transabdominal ultrasound was performed for evaluation of the
gestation as well as the maternal uterus and adnexal regions.

[Series 1: us ob transvaginal · 15 of 41 slices shown]
[im 1/41]
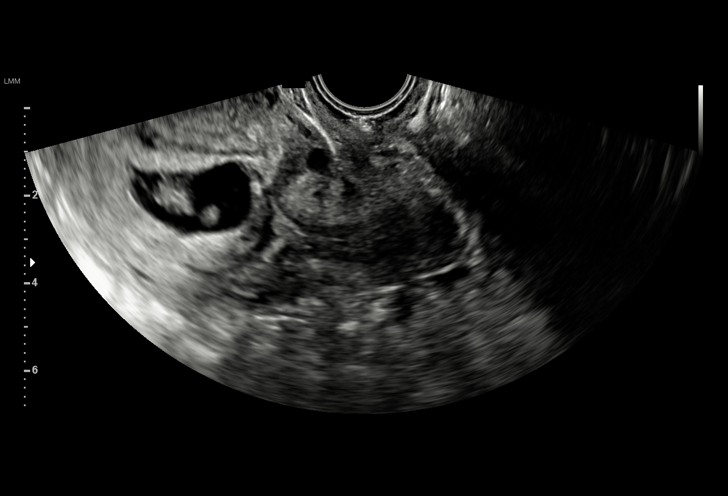
[im 3/41]
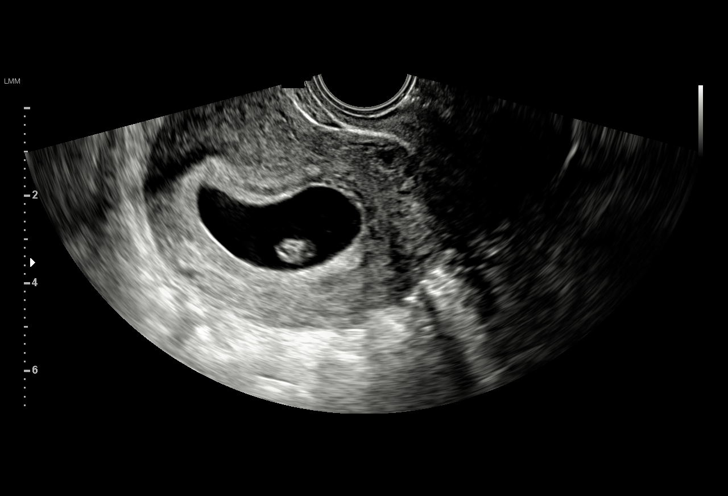
[im 6/41]
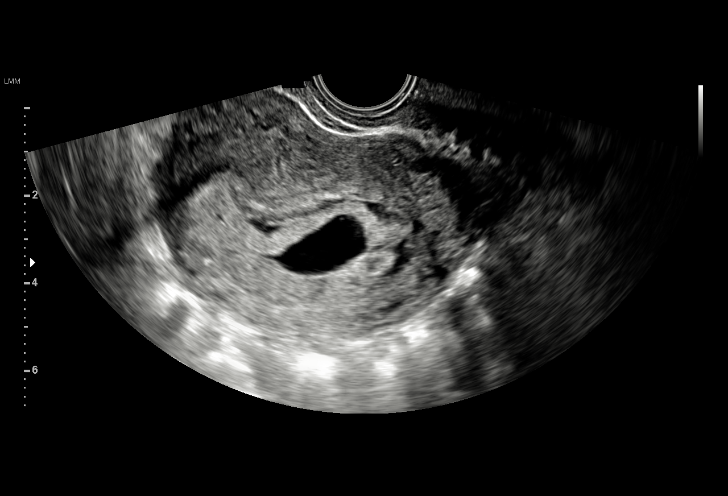
[im 9/41]
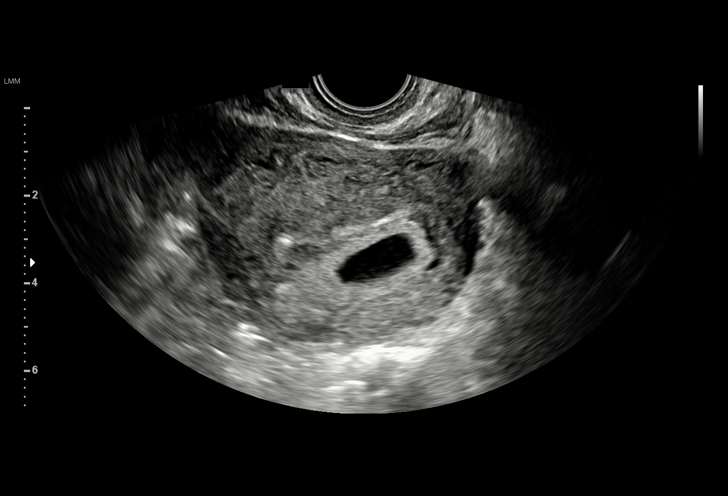
[im 12/41]
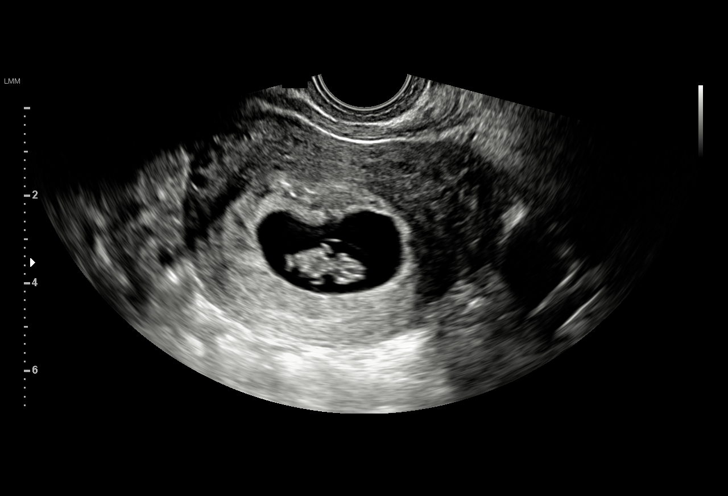
[im 15/41]
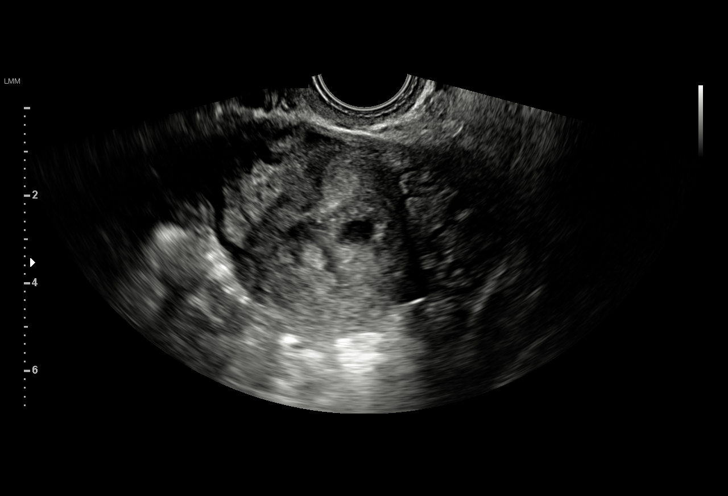
[im 18/41]
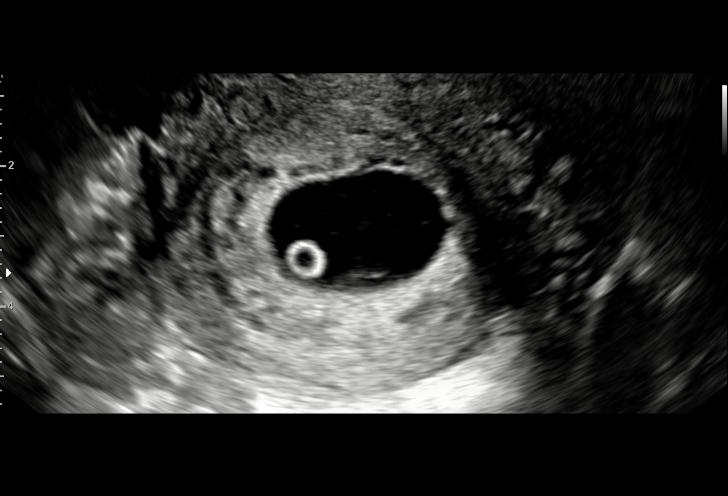
[im 21/41]
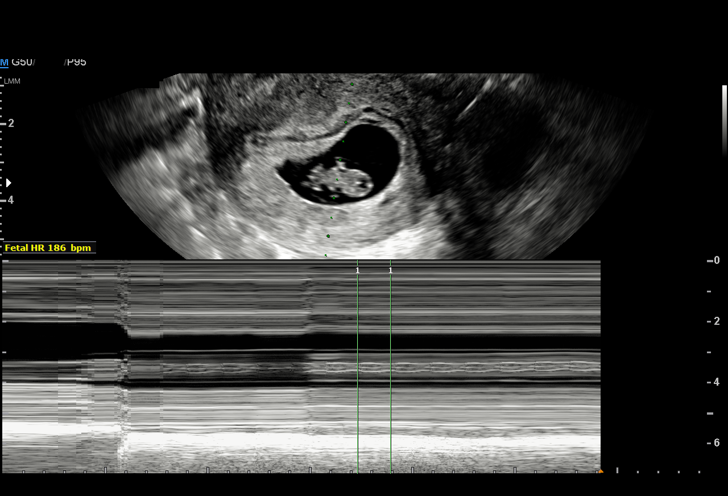
[im 23/41]
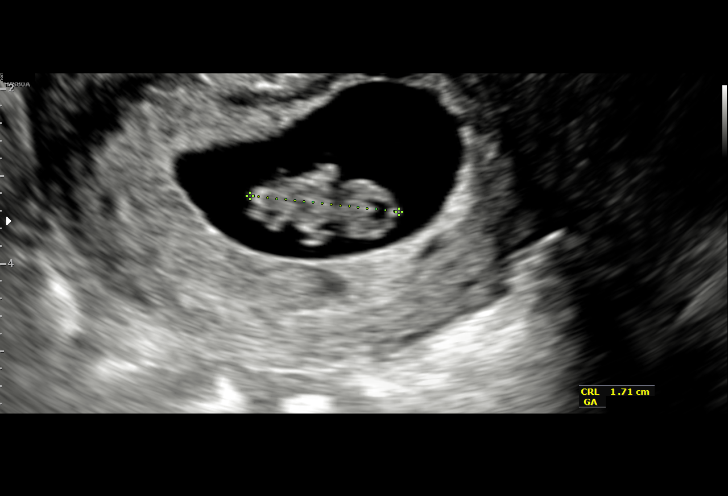
[im 26/41]
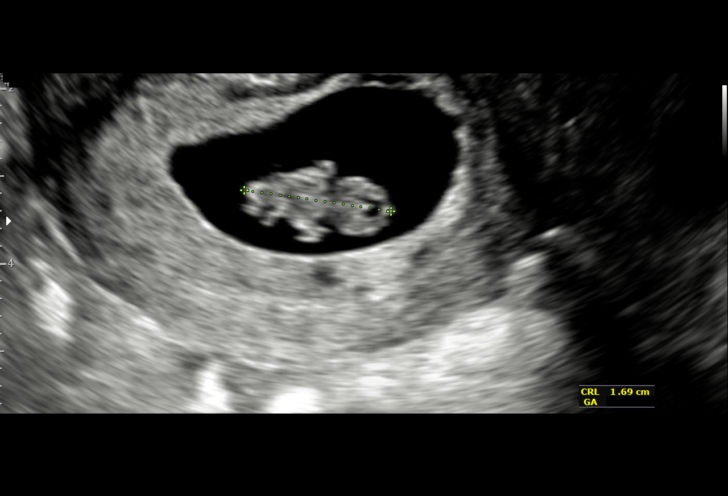
[im 29/41]
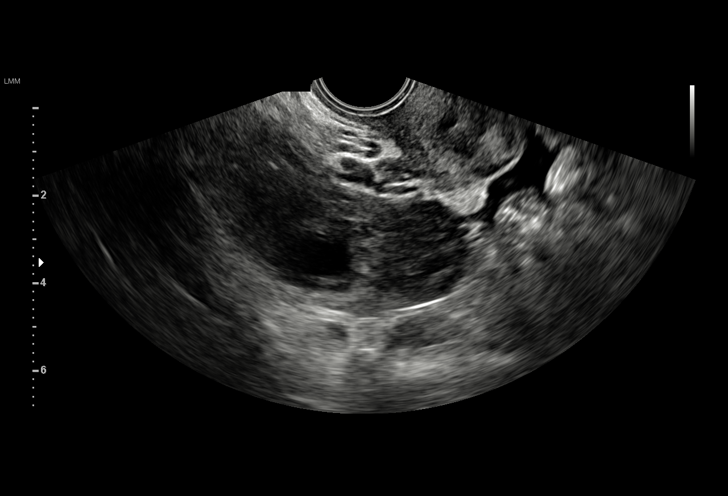
[im 32/41]
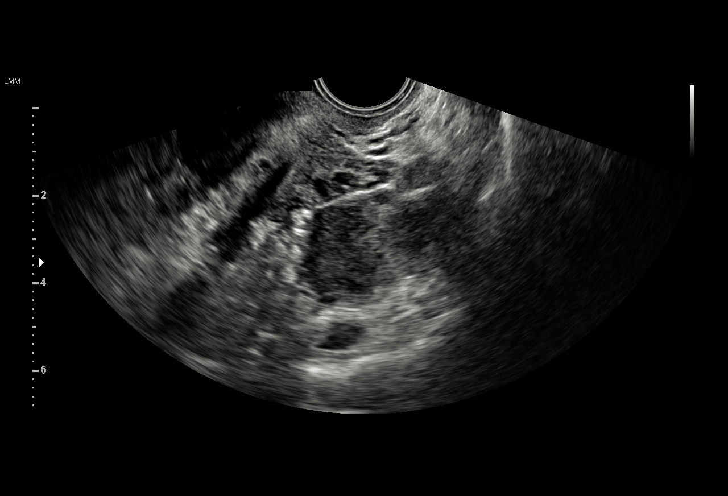
[im 35/41]
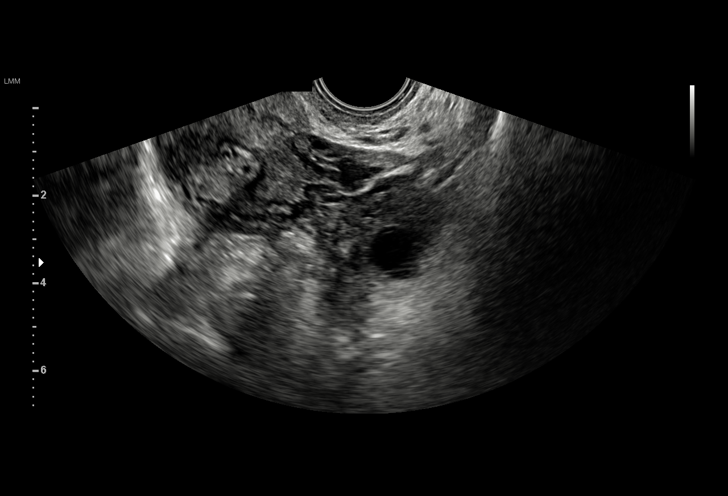
[im 38/41]
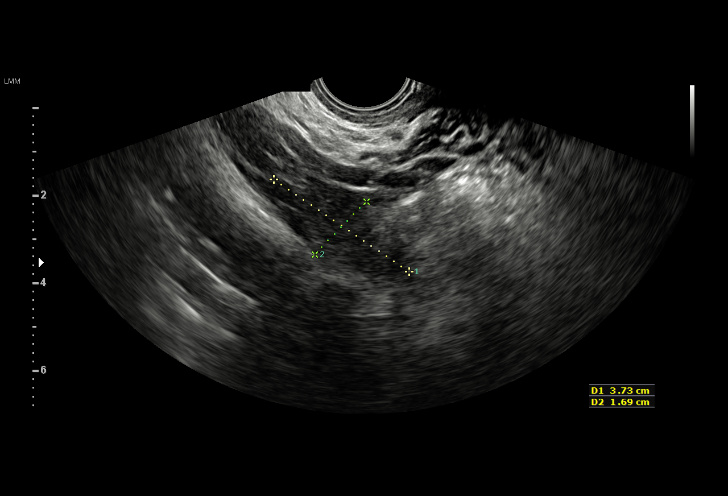
[im 41/41]
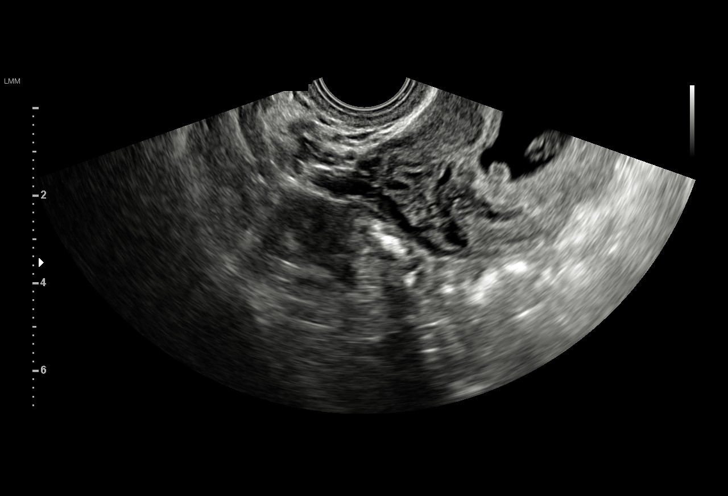

[15 of 28 positions shown; findings below may reference images not displayed]

FINDINGS: Intrauterine gestational sac: Single

Yolk sac:  Visualized.

Embryo:  Visualized.

Cardiac Activity: Visualized.

Heart Rate: 186 bpm

CRL:   17  mm   8 w 0 d                  US EDC: 09/22/2016

Subchorionic hemorrhage:  None visualized.

Maternal uterus/adnexae: Unremarkable right and left ovaries. No
free fluid in the pelvis.
IMPRESSION: Single live intrauterine gestation.  No subchorionic hemorrhage.
